# Patient Record
Sex: Male | Born: 1985 | Race: White | Hispanic: No | Marital: Married | Smoking: Former smoker
Health system: Southern US, Community
[De-identification: ages and names within clinical notes are randomized; demographics above are authoritative.]

## PROBLEM LIST (undated history)

## (undated) DIAGNOSIS — Z524 Kidney donor: Secondary | ICD-10-CM

## (undated) DIAGNOSIS — J45909 Unspecified asthma, uncomplicated: Secondary | ICD-10-CM

## (undated) HISTORY — PX: NEPHRECTOMY: SHX65

## (undated) HISTORY — PX: KIDNEY DONATION: SHX685

## (undated) HISTORY — DX: Unspecified asthma, uncomplicated: J45.909

---

## 2003-09-20 ENCOUNTER — Ambulatory Visit (HOSPITAL_BASED_OUTPATIENT_CLINIC_OR_DEPARTMENT_OTHER): Admission: RE | Admit: 2003-09-20 | Discharge: 2003-09-20 | Payer: Self-pay | Admitting: Orthopedic Surgery

## 2004-07-16 HISTORY — PX: SHOULDER SURGERY: SHX246

## 2008-11-25 ENCOUNTER — Emergency Department (HOSPITAL_COMMUNITY): Admission: EM | Admit: 2008-11-25 | Discharge: 2008-11-25 | Payer: Self-pay | Admitting: Family Medicine

## 2009-03-04 ENCOUNTER — Emergency Department (HOSPITAL_COMMUNITY): Admission: EM | Admit: 2009-03-04 | Discharge: 2009-03-04 | Payer: Self-pay | Admitting: Family Medicine

## 2009-03-18 ENCOUNTER — Encounter: Admission: RE | Admit: 2009-03-18 | Discharge: 2009-03-18 | Payer: Self-pay | Admitting: Family Medicine

## 2009-05-16 ENCOUNTER — Emergency Department (HOSPITAL_COMMUNITY): Admission: EM | Admit: 2009-05-16 | Discharge: 2009-05-16 | Payer: Self-pay | Admitting: Emergency Medicine

## 2010-01-20 ENCOUNTER — Encounter: Admission: RE | Admit: 2010-01-20 | Discharge: 2010-01-20 | Payer: Self-pay | Admitting: Orthopedic Surgery

## 2010-08-05 ENCOUNTER — Encounter: Payer: Self-pay | Admitting: Orthopedic Surgery

## 2010-10-05 ENCOUNTER — Inpatient Hospital Stay (INDEPENDENT_AMBULATORY_CARE_PROVIDER_SITE_OTHER)
Admission: RE | Admit: 2010-10-05 | Discharge: 2010-10-05 | Disposition: A | Discharge status: Home / Self Care | Payer: PRIVATE HEALTH INSURANCE | Source: Ambulatory Visit | Attending: Family Medicine | Admitting: Family Medicine

## 2010-10-05 DIAGNOSIS — J40 Bronchitis, not specified as acute or chronic: Secondary | ICD-10-CM

## 2010-10-05 DIAGNOSIS — J069 Acute upper respiratory infection, unspecified: Secondary | ICD-10-CM

## 2010-10-18 LAB — CBC
HCT: 46.9 % (ref 39.0–52.0)
Hemoglobin: 16.2 g/dL (ref 13.0–17.0)
MCV: 91.6 fL (ref 78.0–100.0)
RDW: 13.6 % (ref 11.5–15.5)

## 2010-10-18 LAB — POCT I-STAT, CHEM 8
BUN: 12 mg/dL (ref 6–23)
Creatinine, Ser: 0.9 mg/dL (ref 0.4–1.5)
Hemoglobin: 17 g/dL (ref 13.0–17.0)
Potassium: 4.1 mEq/L (ref 3.5–5.1)
Sodium: 140 mEq/L (ref 135–145)

## 2010-10-18 LAB — DIFFERENTIAL
Basophils Absolute: 0 10*3/uL (ref 0.0–0.1)
Eosinophils Relative: 6 % — ABNORMAL HIGH (ref 0–5)
Lymphocytes Relative: 26 % (ref 12–46)
Monocytes Absolute: 0.7 10*3/uL (ref 0.1–1.0)

## 2010-12-01 NOTE — Op Note (Signed)
NAME:  Costa, Jorge                            ACCOUNT NO.:  [id number]   MEDICAL RECORD NO.:  [id number]                   PATIENT TYPE:  AMB   LOCATION:  DSC                                  FACILITY:  [facility]   PHYSICIAN:  Robert A. Thurston Hole, M.D.              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   OPERATION PERFORMED:  1. Right shoulder examination under anesthesia followed by arthroscopically     assisted Bankart repair using Arthrex suture anchors times three     anteriorly and one posteriorly.  2. Right shoulder rotator interval closure.  3. Right shoulder arthroscopic partial labrum tear debridement.   SURGEON:  Elana Alm. Thurston Hole, M.D.   ASSISTANT:  Julien Girt, P.A.   ANESTHESIA:  General.   COMPLICATIONS:  None.   OPERATIVE TIME:  1-1/2 hours.   INDICATIONS FOR PROCEDURE:  Mr. 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  Exam and MRI has revealed a Bankart lesion and  labrum tear and he is now to undergo arthroscopy and repair.   DESCRIPTION OF PROCEDURE:  Mr. Rodier was brought to the operating room on  September 20, 2003 after an interscalene block had been placed in the holding  room by anesthesia for postoperative pain control.  He was placed on the  operating room in supine position.  After being placed under general  anesthesia, his right shoulder was examination under anesthesia.  Range of  motion revealed that he had full range of motion with anterior inferior  laxity on the right and subluxability which he had not had on the left.  Both shoulders had similar mild posterior laxity equal  in nature.  He was  then placed in beach chair position and his shoulder and arm was prepped  using sterile DuraPrep and draped using sterile technique.  He received  Ancef 1 g IV preoperatively for prophylaxis.  Initially, arthroscopy was  performed through a posterior arthroscopic portal.  The arthroscope with a  pump attached was placed and through an anterior portal an arthroscopic  probe was placed.  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  Articular surface  on the humeral head was intact.  He had tearing of the anterior inferior  glenohumeral ligament complex and anterior inferior labrum as well as the  middle glenohumeral ligament and this was felt to be repairable  arthroscopically.  The posterior labrum showed mild fraying.  This was  debrided but was not detached.  Biceps tendon anchor and biceps tendon was  intact and the rotator cuff was thoroughly inspected and it was found to be  intact.  Partial tearing of the anterior labrum was debrided.  After this  was done, then an accessory anterior portal was made and then three separate  Arthrex suture anchors were placed, one in the 5 o'clock position, one in  the 3 o'clock and one in the 2 o'clock position on the glenoid rim.  Each of  these anchors had the sutures that were through them arthroscopically passed  starting with the lowest anterior inferior glenohumeral ligament tissue and  then the middle inferior and middle superior glenohumeral ligament tissue.  After each of these sutures was passed, they were tied down arthroscopically  thus completely reconstructing the anterior inferior and middle glenohumeral  ligament complex and eliminating this instability.  After this was done,  then through a posterior portal, a posterior inferior Arthrex suture anchor  was placed in the 7  o'clock position and this suture was arthroscopically  passed through the posterior inferior glenohumeral ligament complex and tied  arthroscopically as well and this reduced any remaining posterior inferior  instability.  At this point the shoulder could not be subluxed and had  significant stability restored.  The rotator interval was then repaired  using a #1 Vicryl suture through the superior aspect of the rotator interval  and through 25% of the subscapularis tendon in the middle glenohumeral  ligament and then this suture was tied extra-articularly, thus reducing any  redundancy in the rotator interval and further tightening the shoulder with  #1 Vicryl suture.  After this was done, it was felt that all of the  pathology had been satisfactorily addressed.  The instruments and cannulas  were removed.  The portals were closed with 3-0 nylon suture.  Sterile  dressings and a shoulder immobilizer was applied, the patient awakened,  extubated and taken to the recovery room in stable condition.  Sponge and  needle counts were correct times two at the end of this case.   FOLLOW UP:  Mr. Furry will be followed as an outpatient on Percocet and  Naprosyn.  See him back in the office in a week for suture removal and  follow-up.                                               Robert A. Thurston Hole, M.D.    RAW/MEDQ  D:  09/20/2003  T:  09/21/2003  Job:  [id number]

## 2012-07-01 ENCOUNTER — Encounter (INDEPENDENT_AMBULATORY_CARE_PROVIDER_SITE_OTHER): Payer: Self-pay | Admitting: General Surgery

## 2012-07-03 ENCOUNTER — Ambulatory Visit (INDEPENDENT_AMBULATORY_CARE_PROVIDER_SITE_OTHER): Payer: PRIVATE HEALTH INSURANCE | Admitting: General Surgery

## 2012-07-03 ENCOUNTER — Encounter (INDEPENDENT_AMBULATORY_CARE_PROVIDER_SITE_OTHER): Payer: Self-pay | Admitting: General Surgery

## 2012-07-03 VITALS — BP 118/80 | HR 76 | Temp 97.6°F | Resp 12 | Ht 70.0 in | Wt 168.6 lb

## 2012-07-03 DIAGNOSIS — K648 Other hemorrhoids: Secondary | ICD-10-CM | POA: Insufficient documentation

## 2012-07-03 NOTE — Progress Notes (Signed)
Patient ID: Jorge Costa, male   DOB: 1986-01-07, 26 y.o.   MRN: [id number]  Chief Complaint  Patient presents with  . New Evaluation    eval hems    HPI Jorge Costa is a 26 y.o. male.   HPI 26 yo WM referred by Dr Clarene Duke for evaluation of hemorrhoids. The patient states he has had intermittent problems with hemorrhoids for several years but  the past year has been more problematic. He states that he has a bowel movement once a day. He states that he used to sit on the commode for prolonged period time but has recently learned that he should not sit on the commode for longer than 5-10 minutes at a time. He does strain occasionally. He drinks on average 5-7 glasses of water a day. He denies any incontinence. He does have pain sometimes when having a bowel movement. He states that his hemorrhoids will tend to flare up. He states that generally they flare up several times a month. When they do flare up none of the hemorrhoids pop out with having a bowel movement he just has a lot of burning and some itching. He has used preparation H ointment as well as Preparation H wipes. He has noticed some blood on the toilet tissue. He denies any family history of colon or rectal cancer. He denies any weight loss. He does use a washcloth and soap to clean his perianal area while in the shower. He does drink a fair amount of caffeine. He states that the itching is more internal. He also uses sitz baths when the hemorrhoids flare Past Medical History  Diagnosis Date  . Childhood asthma     Past Surgical History  Procedure Date  . Shoulder surgery 2006    right    Family History  Problem Relation Age of Onset  . Hypertension Father   . Kidney failure Father     Social History History  Substance Use Topics  . Smoking status: Current Every Day Smoker  . Smokeless tobacco: Never Used  . Alcohol Use: Yes     Comment: 1-2 month    No Known Allergies  Current Outpatient Prescriptions  Medication  Sig Dispense Refill  . hydrocortisone (ANUSOL-HC) 25 MG suppository Place 25 mg rectally 2 (two) times daily.        Review of Systems Review of Systems  Constitutional: Negative for fever, chills, appetite change and unexpected weight change.  HENT: Negative for congestion and trouble swallowing.   Eyes: Negative for visual disturbance.  Respiratory: Negative for chest tightness and shortness of breath.   Cardiovascular: Negative for chest pain and leg swelling.       No PND, no orthopnea, no DOE  Gastrointestinal:       See HPI  Genitourinary: Negative for dysuria and hematuria.  Musculoskeletal: Negative.   Skin: Negative for rash.  Neurological: Negative for seizures and speech difficulty.  Hematological: Does not bruise/bleed easily.  Psychiatric/Behavioral: Negative for behavioral problems and confusion.    Blood pressure 118/80, pulse 76, temperature 97.6 F (36.4 C), temperature source Temporal, resp. rate 12, height 5\' 10"  (1.778 m), weight 168 lb 9.6 oz (76.476 kg).  Physical Exam Physical Exam  Vitals reviewed. Constitutional: He is oriented to person, place, and time. He appears well-developed and well-nourished. No distress.  HENT:  Head: Normocephalic and atraumatic.  Right Ear: External ear normal.  Left Ear: External ear normal.  Eyes: Conjunctivae normal are normal. No scleral icterus.  Neck:  Normal range of motion. Neck supple. No tracheal deviation present. No thyromegaly present.  Cardiovascular: Normal rate, regular rhythm and normal heart sounds.   Pulmonary/Chest: Effort normal and breath sounds normal. No respiratory distress. He has no wheezes.  Abdominal: Soft. Bowel sounds are normal. He exhibits no distension. There is no tenderness.  Genitourinary: Rectal exam shows internal hemorrhoid. Rectal exam shows no external hemorrhoid, no fissure and anal tone normal.       Good tone. Anoscopy - +rt ant & post internal hemorrhoid, rt Ant > post. Small left  lateral internal hemorrhoid  Musculoskeletal: He exhibits no edema.  Neurological: He is alert and oriented to person, place, and time.  Skin: Skin is warm and dry. No rash noted. He is not diaphoretic. No erythema.  Psychiatric: He has a normal mood and affect. His behavior is normal. Judgment and thought content normal.    Data Reviewed Dr Fredirick Maudlin referring note  Assessment    2 column, possible 3 column internal hemorrhoids    Plan    We discussed the etiology of hemorrhoids. The patient was given educational material as well as diagrams. We discussed nonoperative and operative management of hemorrhoidal disease.  We discussed the importance of having a daily soft bowel movement and avoiding constipation. We also discussed good bowel habits such as not reading in the bathroom, not straining, and drinking 6-8 glasses of water per day. We also discussed the importance of a high fiber diet. We discussed foods that were high in fiber as well as fiber supplements. We discussed the importance of trying to get 25-30 g of fiber per day in their diet. We discussed the need to start with a low dose of fiber and then gradually increasing their daily fiber dose over several weeks in order to avoid bloating and cramping.  We then discussed different surgical techniques for hemorrhoids, specifically hemorrhoidal banding and excisional hemorrhoidectomy.  PLAN: The patient is interested in surgical intervention. I believe the majority of his internal hemorrhoids are amenable to hemorrhoidal banding. However the one on the right may require excisional hemorrhoidectomy. Nonetheless he is in agreeable to starting with nonoperative management for a few weeks followed by exam under anesthesia, hemorrhoidal banding and possible excisional hemorrhoidectomy. We discussed the importance of a high-fiber diet and drinking plenty of water regardless of whether or not surgery is performed. He is going to start  nonoperative management and see how he does. If he is still having problems he is going to schedule surgery.  I discussed the procedure in detail.  The patient was given Agricultural engineer.  We discussed the risks and benefits of surgery including, but not limited to bleeding, infection, blood clot formation, anesthesia risk, urinary retention, hemorrhoid recurrence, injury to the sphincters resulting in incontinence, and the rare possibility of anal canal narrowing. I explained that the likelihood of improvement of their symptoms is good  We discussed the typical postoperative course.  I stressed the importance of not becoming constipated after surgery.  The patient was encouraged to limit pain medication if possible as this increases the likelihood of becoming constipated. The patient was advised to take stool softners & drink 8-10 glasses of non-carbonated, non-alcoholic beverages per day and to eat a high fiber diet.  I also encouraged soaking in a water warm bath for 15 minutes at a time several times a day and after a bowel movement.  The patient was advised to take laxatives such as milk of magnesia or Miralax if no  bowel movement three days after surgery.  The patient was advised to expect some blood tinged drainage as well as some blood in their bowel movements.   Mary Sella. Andrey Campanile, MD, FACS General, Bariatric, & Minimally Invasive Surgery [facility], [place]        [facility] M 07/03/2012, 5:04 PM

## 2012-07-03 NOTE — Patient Instructions (Addendum)
Call our office [phone] if you decide you would like to proceed with surgery  GETTING TO GOOD BOWEL HEALTH. Irregular bowel habits such as constipation and diarrhea can lead to many problems over time.  Having one soft bowel movement a day is the most important way to prevent further problems.  The anorectal canal is designed to handle stretching and feces to safely manage our ability to get rid of solid waste (feces, poop, stool) out of our body.  BUT, hard constipated stools can act like ripping concrete bricks and diarrhea can be a burning fire to this very sensitive area of our body, causing inflamed hemorrhoids, anal fissures, increasing risk is perirectal abscesses, abdominal pain/bloating, an making irritable bowel worse.     The goal: ONE SOFT BOWEL MOVEMENT A DAY!  To have soft, regular bowel movements:    Drink at least 8 tall glasses of water a day.     Take plenty of fiber.  Fiber is the undigested part of plant food that passes into the colon, acting s "natures broom" to encourage bowel motility and movement.  Fiber can absorb and hold large amounts of water. This results in a larger, bulkier stool, which is soft and easier to pass. Work gradually over several weeks up to 6 servings a day of fiber (25g a day even more if needed) in the form of: o Vegetables -- Root (potatoes, carrots, turnips), leafy green (lettuce, salad greens, celery, spinach), or cooked high residue (cabbage, broccoli, etc) o Fruit -- Fresh (unpeeled skin & pulp), Dried (prunes, apricots, cherries, etc ),  or stewed ( applesauce)  o Whole grain breads, pasta, etc (whole wheat)  o Bran cereals    Bulking Agents -- This type of water-retaining fiber generally is easily obtained each day by one of the following:  o Psyllium bran -- The psyllium plant is remarkable because its ground seeds can retain so much water. This product is available as Metamucil, Konsyl, Effersyllium, Per Diem Fiber, or the less expensive generic  preparation in drug and health food stores. Although labeled a laxative, it really is not a laxative.  o Methylcellulose -- This is another fiber derived from wood which also retains water. It is available as Citrucel. o Polyethylene Glycol - and "artificial" fiber commonly called Miralax or Glycolax.  It is helpful for people with gassy or bloated feelings with regular fiber o Flax Seed - a less gassy fiber than psyllium   No reading or other relaxing activity while on the toilet. If bowel movements take longer than 5 minutes, you are too constipated   AVOID CONSTIPATION.  High fiber and water intake usually takes care of this.  Sometimes a laxative is needed to stimulate more frequent bowel movements, but    Laxatives are not a good long-term solution as it can wear the colon out. o Osmotics (Milk of Magnesia, Fleets phosphosoda, Magnesium citrate, MiraLax, GoLytely) are safer than  o Stimulants (Senokot, Castor Oil, Dulcolax, Ex Lax)    o Do not take laxatives for more than 7days in a row.    IF SEVERELY CONSTIPATED, try a Bowel Retraining Program: o Do not use laxatives.  o Eat a diet high in roughage, such as bran cereals and leafy vegetables.  o Drink six (6) ounces of prune or apricot juice each morning.  o Eat two (2) large servings of stewed fruit each day.  o Take one (1) heaping tablespoon of a psyllium-based bulking agent twice a day. Use sugar-free  sweetener when possible to avoid excessive calories.  o Eat a normal breakfast.  o Set aside 15 minutes after breakfast to sit on the toilet, but do not strain to have a bowel movement.  o If you do not have a bowel movement by the third day, use an enema and repeat the above steps.    Controlling diarrhea o Switch to liquids and simpler foods for a few days to avoid stressing your intestines further. o Avoid dairy products (especially milk & ice cream) for a short time.  The intestines often can lose the ability to digest lactose when  stressed. o Avoid foods that cause gassiness or bloating.  Typical foods include beans and other legumes, cabbage, broccoli, and dairy foods.  Every person has some sensitivity to other foods, so listen to our body and avoid those foods that trigger problems for you. o Adding fiber (Citrucel, Metamucil, psyllium, Miralax) gradually can help thicken stools by absorbing excess fluid and retrain the intestines to act more normally.  Slowly increase the dose over a few weeks.  Too much fiber too soon can backfire and cause cramping & bloating. o Probiotics (such as active yogurt, Align, etc) may help repopulate the intestines and colon with normal bacteria and calm down a sensitive digestive tract.  Most studies show it to be of mild help, though, and such products can be costly. o Medicines:   Bismuth subsalicylate (ex. Kayopectate, Pepto Bismol) every 30 minutes for up to 6 doses can help control diarrhea.  Avoid if pregnant.   Loperamide (Immodium) can slow down diarrhea.  Start with two tablets (4mg  total) first and then try one tablet every 6 hours.  Avoid if you are having fevers or severe pain.  If you are not better or start feeling worse, stop all medicines and call your doctor for advice o Call your doctor if you are getting worse or not better.  Sometimes further testing (cultures, endoscopy, X-ray studies, bloodwork, etc) may be needed to help diagnose and treat the cause of the diarrhea.  Fiber Content in Foods Drinking plenty of fluids and consuming foods high in fiber can help with constipation. See the list below for the fiber content of some common foods. Starches and Grains / Dietary Fiber (g)  Cheerios, 1 cup / 3 g  Kellogg's Corn Flakes, 1 cup / 0.7 g  Rice Krispies, 1  cup / 0.3 g  Quaker Oat Life Cereal,  cup / 2.1 g  Oatmeal, instant (cooked),  cup / 2 g  Kellogg's Frosted Mini Wheats, 1 cup / 5.1 g  Rice, brown, long-grain (cooked), 1 cup / 3.5 g  Rice, white,  long-grain (cooked), 1 cup / 0.6 g  Macaroni, cooked, enriched, 1 cup / 2.5 g Legumes / Dietary Fiber (g)  Beans, baked, canned, plain or vegetarian,  cup / 5.2 g  Beans, kidney, canned,  cup / 6.8 g  Beans, pinto, dried (cooked),  cup / 7.7 g  Beans, pinto, canned,  cup / 5.5 g Breads and Crackers / Dietary Fiber (g)  Graham crackers, plain or honey, 2 squares / 0.7 g  Saltine crackers, 3 squares / 0.3 g  Pretzels, plain, salted, 10 pieces / 1.8 g  Bread, whole-wheat, 1 slice / 1.9 g  Bread, white, 1 slice / 0.7 g  Bread, raisin, 1 slice / 1.2 g  Bagel, plain, 3 oz / 2 g  Tortilla, flour, 1 oz / 0.9 g  Tortilla, corn, 1 small /  1.5 g  Bun, hamburger or hotdog, 1 small / 0.9 g Fruits / Dietary Fiber (g)  Apple, raw with skin, 1 medium / 4.4 g  Applesauce, sweetened,  cup / 1.5 g  Banana,  medium / 1.5 g  Grapes, 10 grapes / 0.4 g  Orange, 1 small / 2.3 g  Raisin, 1.5 oz / 1.6 g  Melon, 1 cup / 1.4 g Vegetables / Dietary Fiber (g)  Green beans, canned,  cup / 1.3 g  Carrots (cooked),  cup / 2.3 g  Broccoli (cooked),  cup / 2.8 g  Peas, frozen (cooked),  cup / 4.4 g  Potatoes, mashed,  cup / 1.6 g  Lettuce, 1 cup / 0.5 g  Corn, canned,  cup / 1.6 g  Tomato,  cup / 1.1 g Document Released: 11/18/2006 Document Revised: 09/24/2011 Document Reviewed: 01/13/2007 [facility] Patient Information 2013 [place], [place].

## 2012-11-03 DIAGNOSIS — Z524 Kidney donor: Secondary | ICD-10-CM | POA: Insufficient documentation

## 2012-11-21 DIAGNOSIS — S301XXA Contusion of abdominal wall, initial encounter: Secondary | ICD-10-CM | POA: Insufficient documentation

## 2013-04-06 ENCOUNTER — Emergency Department (HOSPITAL_COMMUNITY)
Admission: EM | Admit: 2013-04-06 | Discharge: 2013-04-06 | Disposition: A | Discharge status: Home / Self Care | Payer: PRIVATE HEALTH INSURANCE | Attending: Emergency Medicine | Admitting: Emergency Medicine

## 2013-04-06 ENCOUNTER — Encounter (HOSPITAL_COMMUNITY): Payer: Self-pay | Admitting: Emergency Medicine

## 2013-04-06 ENCOUNTER — Emergency Department (HOSPITAL_COMMUNITY): Payer: PRIVATE HEALTH INSURANCE

## 2013-04-06 DIAGNOSIS — J45909 Unspecified asthma, uncomplicated: Secondary | ICD-10-CM | POA: Insufficient documentation

## 2013-04-06 DIAGNOSIS — Z87891 Personal history of nicotine dependence: Secondary | ICD-10-CM | POA: Insufficient documentation

## 2013-04-06 DIAGNOSIS — I861 Scrotal varices: Secondary | ICD-10-CM

## 2013-04-06 LAB — URINALYSIS, ROUTINE W REFLEX MICROSCOPIC
Glucose, UA: NEGATIVE mg/dL
Hgb urine dipstick: NEGATIVE
Specific Gravity, Urine: 1.017 (ref 1.005–1.030)
Urobilinogen, UA: 0.2 mg/dL (ref 0.0–1.0)

## 2013-04-06 NOTE — ED Provider Notes (Signed)
CSN: [id number]     Arrival date & time 04/06/13  0804 History   First MD Initiated Contact with Patient 04/06/13 [phone]     Chief Complaint  Patient presents with  . Groin Swelling   (Consider location/radiation/quality/duration/timing/severity/associated sxs/prior Treatment) Patient is a 27 y.o. male presenting with male genitourinary complaint.  Male GU Problem Presenting symptoms comment:  Moderate sized left sided scrotal swelling Context comment:  Heavy lifting 2 days ago during white water rafting Relieved by:  Nothing Worsened by:  Nothing tried Associated symptoms: scrotal swelling   Associated symptoms: no abdominal pain, no diarrhea, no fever, no groin pain, no hematuria, no nausea, no penile redness, no penile swelling, no urinary frequency and no vomiting     Past Medical History  Diagnosis Date  . Childhood asthma    Past Surgical History  Procedure Laterality Date  . Shoulder surgery  2006    right  . Kidney donation    . Nephrectomy     Family History  Problem Relation Age of Onset  . Hypertension Father   . Kidney failure Father    History  Substance Use Topics  . Smoking status: Former Games developer  . Smokeless tobacco: Never Used  . Alcohol Use: Yes     Comment: 1-2 month    Review of Systems  Constitutional: Negative for fever.  HENT: Negative for congestion.   Respiratory: Negative for cough and shortness of breath.   Cardiovascular: Negative for chest pain.  Gastrointestinal: Negative for nausea, vomiting, abdominal pain and diarrhea.  Genitourinary: Positive for scrotal swelling. Negative for frequency, hematuria and penile swelling.  All other systems reviewed and are negative.    Allergies  Nsaids  Home Medications   Current Outpatient Rx  Name  Route  Sig  Dispense  Refill  . acetaminophen (TYLENOL) 500 MG tablet   Oral   Take 1,000 mg by mouth every 6 (six) hours as needed for pain.          BP 121/76  Pulse 70  Temp(Src) 98 F  (36.7 C) (Oral)  Resp 18  SpO2 97% Physical Exam  Nursing note and vitals reviewed. Constitutional: He is oriented to person, place, and time. He appears well-developed and well-nourished. No distress.  HENT:  Head: Normocephalic and atraumatic.  Mouth/Throat: Oropharynx is clear and moist.  Eyes: Conjunctivae are normal. Pupils are equal, round, and reactive to light. No scleral icterus.  Neck: Neck supple.  Cardiovascular: Normal rate, regular rhythm, normal heart sounds and intact distal pulses.   No murmur heard. Pulmonary/Chest: Effort normal and breath sounds normal. No stridor. No respiratory distress. He has no wheezes. He has no rales.  Abdominal: Soft. He exhibits no distension. There is no tenderness. Hernia confirmed negative in the right inguinal area and confirmed negative in the left inguinal area.  Genitourinary: Penis normal. Right testis shows no swelling and no tenderness. Left testis shows swelling and tenderness (minimal).  Musculoskeletal: Normal range of motion. He exhibits no edema.  Lymphadenopathy:       Left: Inguinal adenopathy:  unable to palpate a defect.  Neurological: He is alert and oriented to person, place, and time.  Skin: Skin is warm and dry. No rash noted.  Psychiatric: He has a normal mood and affect. His behavior is normal.    ED Course  Procedures (including critical care time) Labs Review Labs Reviewed  URINALYSIS, ROUTINE W REFLEX MICROSCOPIC   Imaging Review US Scrotum  04/06/2013   CLINICAL DATA:  27 year old male with pain in groin swelling. Recent heavy lifting.  EXAM: SCROTAL ULTRASOUND  DOPPLER ULTRASOUND OF THE TESTICLES  TECHNIQUE: Complete ultrasound examination of the testicles, epididymis, and other scrotal structures was performed. Color and spectral Doppler ultrasound were also utilized to evaluate blood flow to the testicles.  COMPARISON:  None.  FINDINGS: Right testis:  4.9 x 2.5 x 2.7 cm. Normal echotexture.  Left testis:   4.5 x 2.9 x 3.0 cm. Normal echotexture.  Right epididymis:  Normal in size and appearance.  Left epididymis:  Normal in size and appearance.  Hydrocele:  Small simple appearing hydrocele on the left.  Varicocele: Positive varicocele on the left (images 54 and 55). The left inguinal canal is mildly heterogeneous. No discrete bowel loop is identified.  Pulsed Doppler interrogation of both testes demonstrates low resistance arterial and venous waveforms bilaterally.  IMPRESSION: 1. No evidence of testicular torsion.  2. Small left hydrocele. Left varicocele. The possibility of a small left inguinal hernia cannot be excluded.   Electronically Signed   By: Augusto Gamble M.D.   On: 04/06/2013 09:52   [place] Art/ven Flow Abd Pelv Doppler  04/06/2013   CLINICAL DATA:  27 year old male with pain in groin swelling. Recent heavy lifting.  EXAM: SCROTAL ULTRASOUND  DOPPLER ULTRASOUND OF THE TESTICLES  TECHNIQUE: Complete ultrasound examination of the testicles, epididymis, and other scrotal structures was performed. Color and spectral Doppler ultrasound were also utilized to evaluate blood flow to the testicles.  COMPARISON:  None.  FINDINGS: Right testis:  4.9 x 2.5 x 2.7 cm. Normal echotexture.  Left testis:  4.5 x 2.9 x 3.0 cm. Normal echotexture.  Right epididymis:  Normal in size and appearance.  Left epididymis:  Normal in size and appearance.  Hydrocele:  Small simple appearing hydrocele on the left.  Varicocele: Positive varicocele on the left (images 54 and 55). The left inguinal canal is mildly heterogeneous. No discrete bowel loop is identified.  Pulsed Doppler interrogation of both testes demonstrates low resistance arterial and venous waveforms bilaterally.  IMPRESSION: 1. No evidence of testicular torsion.  2. Small left hydrocele. Left varicocele. The possibility of a small left inguinal hernia cannot be excluded.   Electronically Signed   By: Augusto Gamble M.D.   On: 04/06/2013 09:52    MDM   1. Varicocele    Left  testicular swelling. Minimal tenderness. Unable to palpate a defect.  Ultrasound consistent with varicocele. I don't think he has a hernia.  He does not have a palpable defect or significant tenderness or pain.  Will refer to urology.  Return precautions given.     Candyce Churn, MD 04/06/13 [phone]

## 2013-04-06 NOTE — ED Notes (Signed)
Pt returned from US

## 2013-04-06 NOTE — ED Notes (Signed)
Pt reports swollen painful area to left testicle which he noticed yesterday. Denies area has redness, warmth or discoloration. Pt alert, oriented x4, NAD.

## 2013-07-02 DIAGNOSIS — I861 Scrotal varices: Secondary | ICD-10-CM | POA: Insufficient documentation

## 2013-07-02 DIAGNOSIS — IMO0002 Reserved for concepts with insufficient information to code with codable children: Secondary | ICD-10-CM | POA: Insufficient documentation

## 2014-08-21 ENCOUNTER — Emergency Department (INDEPENDENT_AMBULATORY_CARE_PROVIDER_SITE_OTHER)
Admission: EM | Admit: 2014-08-21 | Discharge: 2014-08-21 | Disposition: A | Discharge status: Home / Self Care | Payer: PRIVATE HEALTH INSURANCE | Source: Home / Self Care | Attending: Family Medicine | Admitting: Family Medicine

## 2014-08-21 DIAGNOSIS — H6591 Unspecified nonsuppurative otitis media, right ear: Secondary | ICD-10-CM

## 2014-08-21 DIAGNOSIS — J039 Acute tonsillitis, unspecified: Secondary | ICD-10-CM

## 2014-08-21 MED ORDER — AMOXICILLIN-POT CLAVULANATE 875-125 MG PO TABS: 1.0000 | ORAL_TABLET | Freq: Two times a day (BID) | ORAL | Status: DC

## 2014-08-21 MED ORDER — PREDNISONE 10 MG PO TABS: 30.0000 mg | ORAL_TABLET | Freq: Every day | ORAL | Status: DC

## 2014-08-21 NOTE — Discharge Instructions (Signed)
Thank you for coming in today. °Call or go to the emergency room if you get worse, have trouble breathing, have chest pains, or palpitations.  ° ° °Tonsillitis °Tonsillitis is an infection of the throat that causes the tonsils to become red, tender, and swollen. Tonsils are collections of lymphoid tissue at the back of the throat. Each tonsil has crevices (crypts). Tonsils help fight nose and throat infections and keep infection from spreading to other parts of the body for the first 18 months of life.  °CAUSES °Sudden (acute) tonsillitis is usually caused by infection with streptococcal bacteria. Long-lasting (chronic) tonsillitis occurs when the crypts of the tonsils become filled with pieces of food and bacteria, which makes it easy for the tonsils to become repeatedly infected. °SYMPTOMS  °Symptoms of tonsillitis include: °· A sore throat, with possible difficulty swallowing. °· White patches on the tonsils. °· Fever. °· Tiredness. °· New episodes of snoring during sleep, when you did not snore before. °· Small, foul-smelling, yellowish-white pieces of material (tonsilloliths) that you occasionally cough up or spit out. The tonsilloliths can also cause you to have bad breath. °DIAGNOSIS °Tonsillitis can be diagnosed through a physical exam. Diagnosis can be confirmed with the results of lab tests, including a throat culture. °TREATMENT  °The goals of tonsillitis treatment include the reduction of the severity and duration of symptoms and prevention of associated conditions. Symptoms of tonsillitis can be improved with the use of steroids to reduce the swelling. Tonsillitis caused by bacteria can be treated with antibiotic medicines. Usually, treatment with antibiotic medicines is started before the cause of the tonsillitis is known. However, if it is determined that the cause is not bacterial, antibiotic medicines will not treat the tonsillitis. If attacks of tonsillitis are severe and frequent, your health care  provider may recommend surgery to remove the tonsils (tonsillectomy). °HOME CARE INSTRUCTIONS  °· Rest as much as possible and get plenty of sleep. °· Drink plenty of fluids. While the throat is very sore, eat soft foods or liquids, such as sherbet, soups, or instant breakfast drinks. °· Eat frozen ice pops. °· Gargle with a warm or cold liquid to help soothe the throat. Mix 1/4 teaspoon of salt and 1/4 teaspoon of baking soda in 8 oz of water. °SEEK MEDICAL CARE IF:  °· Large, tender lumps develop in your neck. °· A rash develops. °· A green, yellow-brown, or bloody substance is coughed up. °· You are unable to swallow liquids or food for 24 hours. °· You notice that only one of the tonsils is swollen. °SEEK IMMEDIATE MEDICAL CARE IF:  °· You develop any new symptoms such as vomiting, severe headache, stiff neck, chest pain, or trouble breathing or swallowing. °· You have severe throat pain along with drooling or voice changes. °· You have severe pain, unrelieved with recommended medications. °· You are unable to fully open the mouth. °· You develop redness, swelling, or severe pain anywhere in the neck. °· You have a fever. °MAKE SURE YOU:  °· Understand these instructions. °· Will watch your condition. °· Will get help right away if you are not doing well or get worse. °Document Released: 04/11/2005 Document Revised: 11/16/2013 Document Reviewed: 12/19/2012 °Exit[employer]® Patient Information ©2015 [employer]. This information is not intended to replace advice given to you by your health care provider. Make sure you discuss any questions you have with your health care provider. ° °

## 2014-08-21 NOTE — ED Notes (Signed)
Patient c/o sore throat and sinus issues x 1 week

## 2014-08-21 NOTE — ED Provider Notes (Signed)
Jorge Costa is a 29 y.o. male who presents to Urgent Care today for sore throat postnasal drip cough and right-sided ear pressure present for 5-6 days. No vomiting or diarrhea. No trouble breathing. Patient has tried over-the-counter medications which have helped a bit.   Past Medical History  Diagnosis Date  . Childhood asthma    Past Surgical History  Procedure Laterality Date  . Shoulder surgery  2006    right  . Kidney donation    . Nephrectomy     History  Substance Use Topics  . Smoking status: Former Games developermoker  . Smokeless tobacco: Never Used  . Alcohol Use: Yes     Comment: 1-2 month   ROS as above Medications: No current facility-administered medications for this encounter.   Current Outpatient Prescriptions  Medication Sig Dispense Refill  . amoxicillin-clavulanate (AUGMENTIN) 875-125 MG per tablet Take 1 tablet by mouth every 12 (twelve) hours. 14 tablet 0  . predniSONE (DELTASONE) 10 MG tablet Take 3 tablets (30 mg total) by mouth daily. 15 tablet 0   Allergies  Allergen Reactions  . Nsaids Other (See Comments)    Patient has 1 kidney     Exam:  BP 132/78 mmHg  Pulse 94  Temp(Src) 98.1 F (36.7 C) (Oral)  Ht 5\' 10"  (1.778 m)  Wt 188 lb (85.276 kg)  BMI 26.98 kg/m2  SpO2 96% Gen: Well NAD HEENT: EOMI,  MMM tonsils are erythematous and swollen bilaterally with exudate. Right tympanic membrane with effusion without erythema. Left is normal-appearing. Nontender mastoids bilaterally. Lungs: Normal work of breathing. CTABL Heart: RRR no MRG Abd: NABS, Soft. Nondistended, Nontender Exts: Brisk capillary refill, warm and well perfused.   No results found for this or any previous visit (from the past 24 hour(s)). No results found.  Assessment and Plan: 29 y.o. male with tonsillitis. Treat with Augmentin and prednisone. Return as needed.  Discussed warning signs or symptoms. Please see discharge instructions. Patient expresses understanding.     Rodolph BongEvan  S Mandie Crabbe, MD 08/21/14 [phone]

## 2015-03-14 IMAGING — US US SCROTUM
1 series · 14 of 25 positions shown · non-contrast
Comparison: None.

CLINICAL DATA: 27-year-old male with pain in groin swelling. Recent
heavy lifting.

EXAM:
SCROTAL ULTRASOUND
DOPPLER ULTRASOUND OF THE TESTICLES
TECHNIQUE: Complete ultrasound examination of the testicles, epididymis, and
other scrotal structures was performed. Color and spectral Doppler
ultrasound were also utilized to evaluate blood flow to the
testicles.

[Series 1: us scrotum · 0.06mm/px · 14 of 73 slices shown]
[im 1/73]
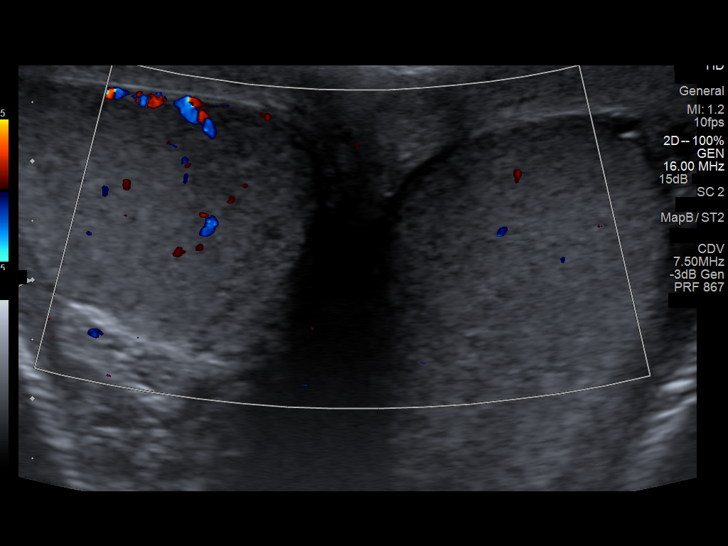
[im 7/73]
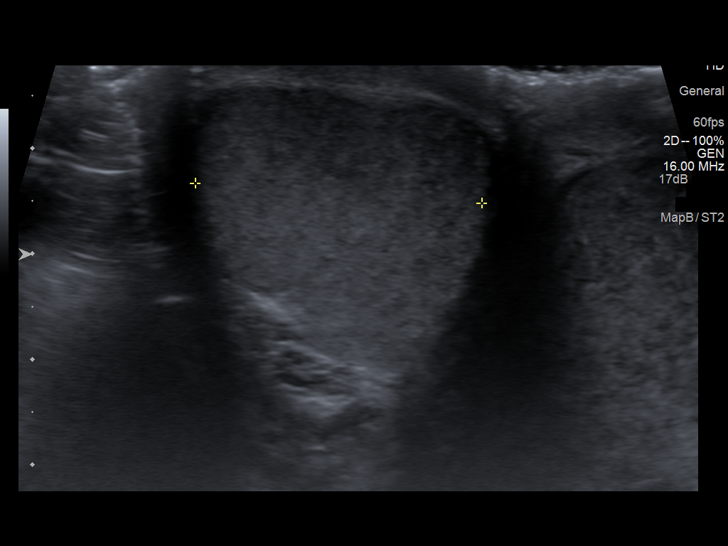
[im 13/73]
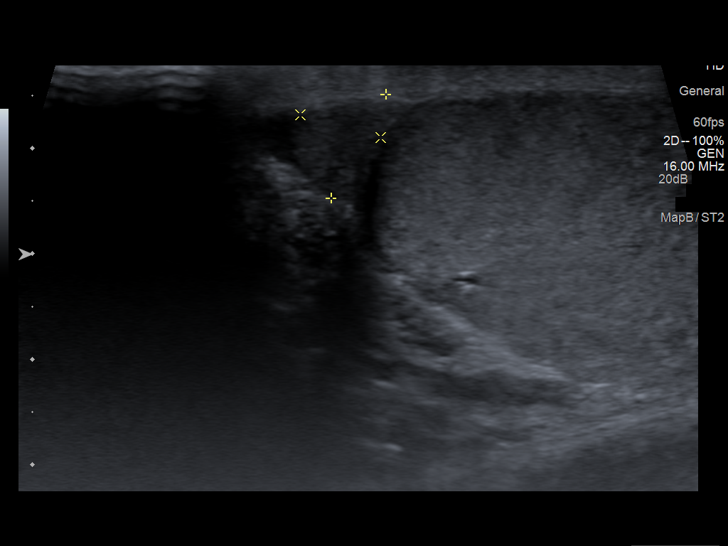
[im 19/73]
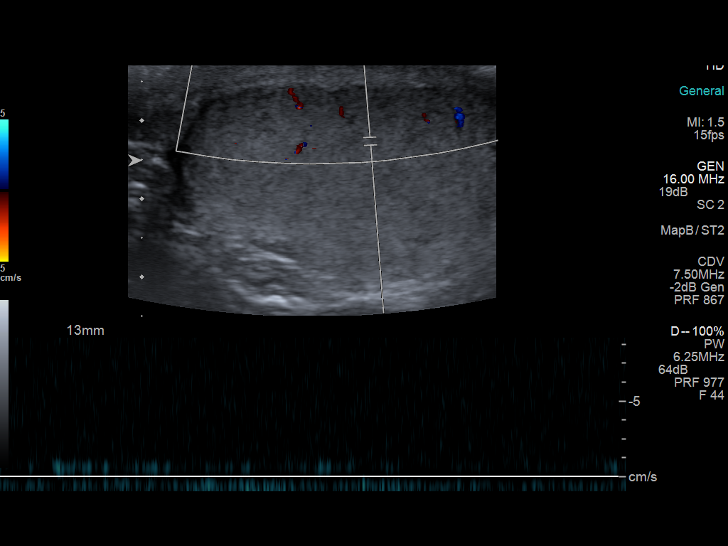
[im 25/73]
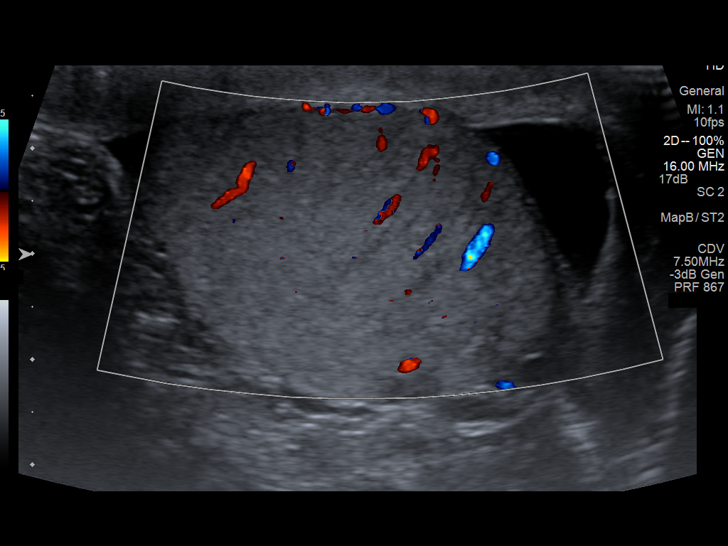
[im 28/73]
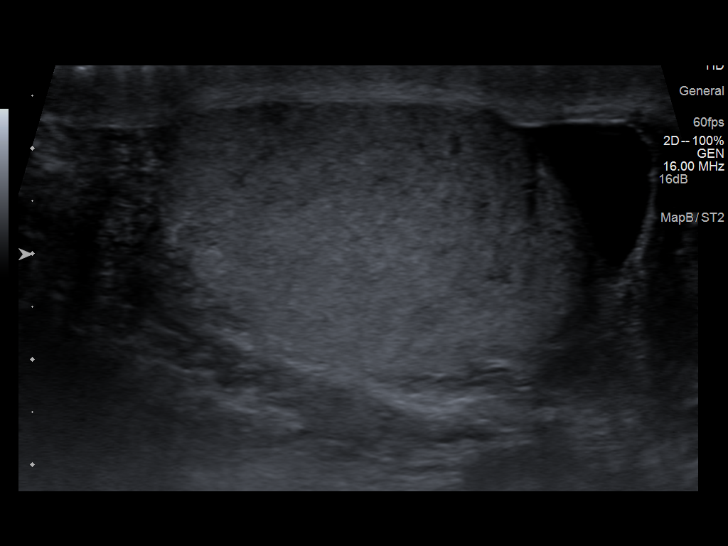
[im 34/73]
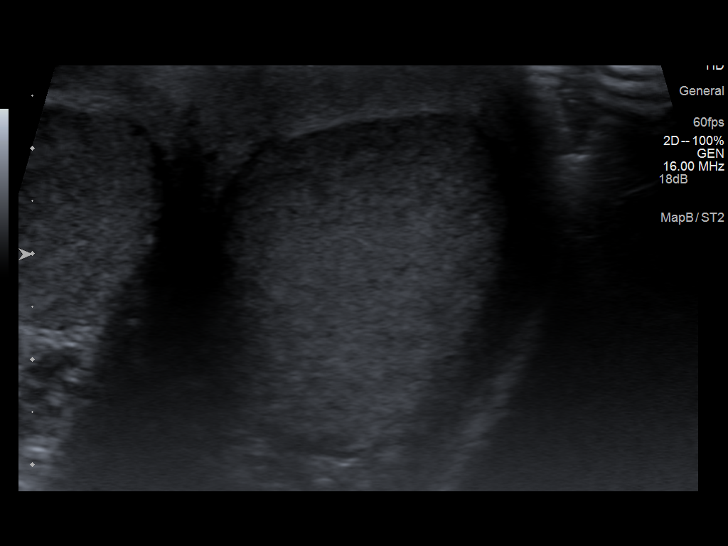
[im 40/73]
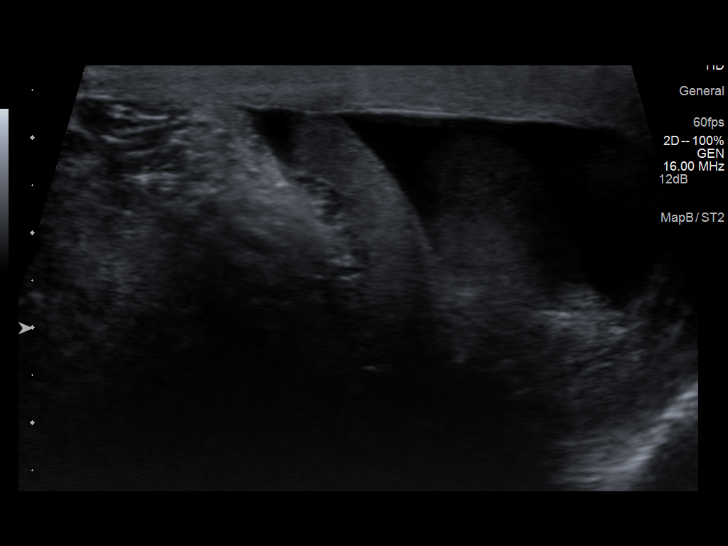
[im 46/73]
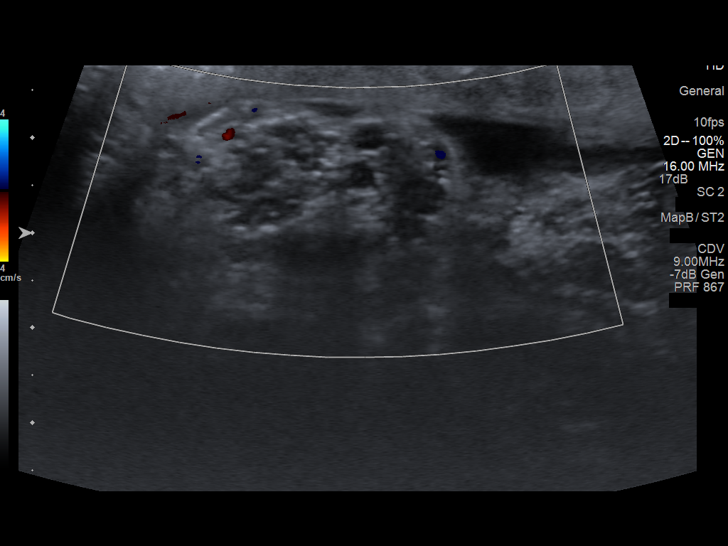
[im 49/73]
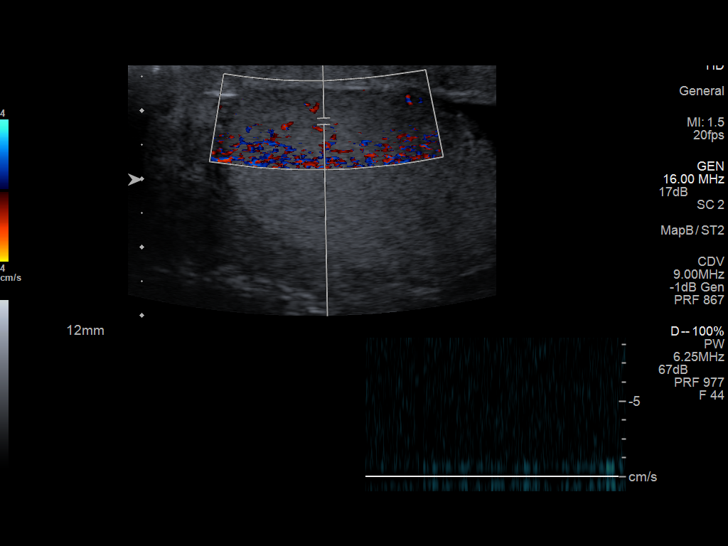
[im 55/73]
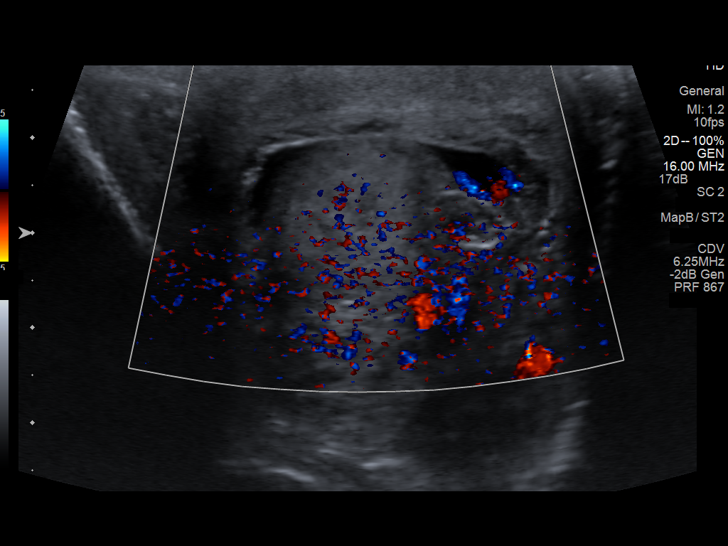
[im 61/73]
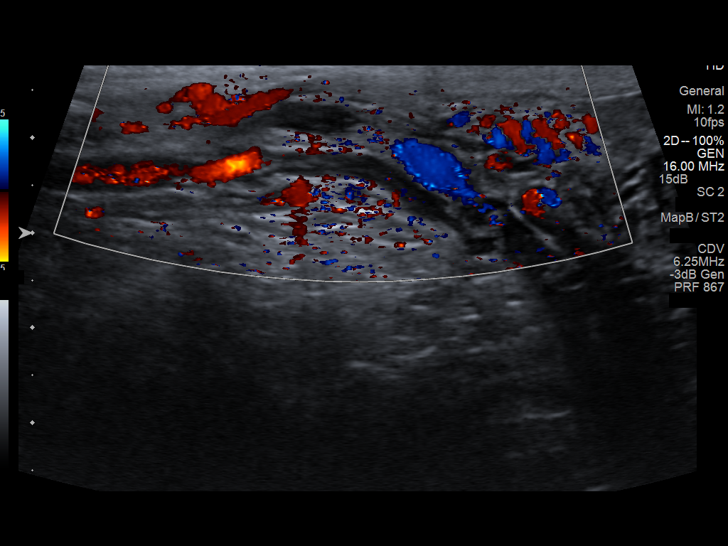
[im 67/73]
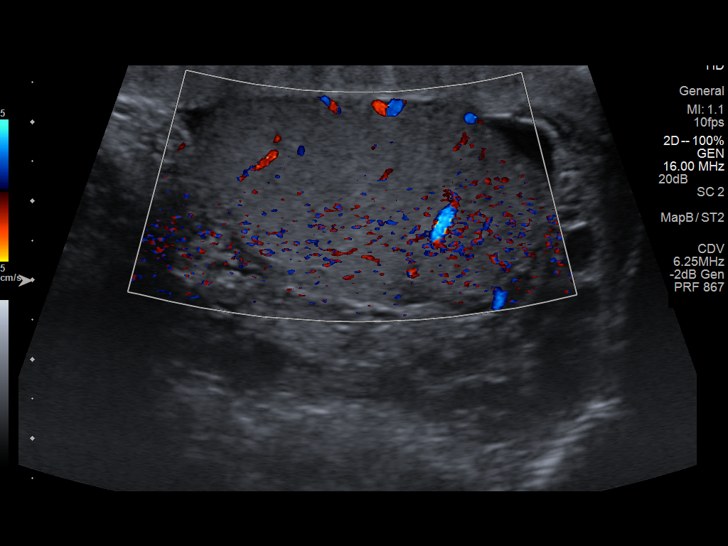
[im 73/73]
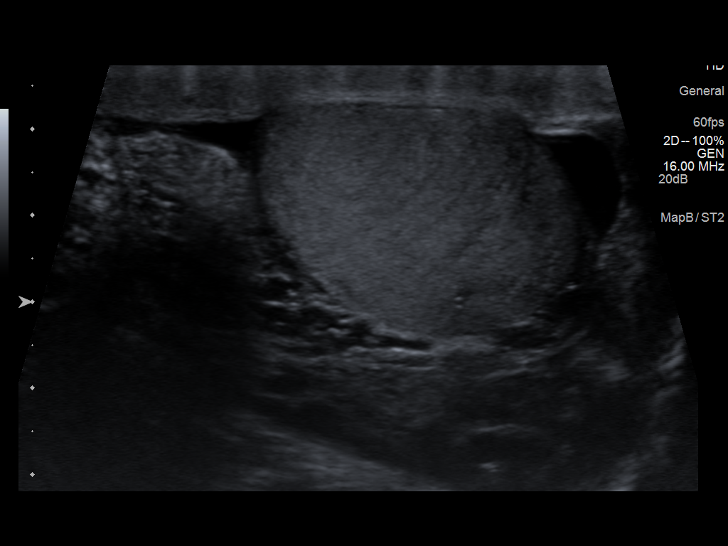

[14 of 25 positions shown; findings below may reference images not displayed]

FINDINGS: Right testis:  4.9 x 2.5 x 2.7 cm. Normal echotexture.

Left testis:  4.5 x 2.9 x 3.0 cm. Normal echotexture.

Right epididymis:  Normal in size and appearance.

Left epididymis:  Normal in size and appearance.

Hydrocele:  Small simple appearing hydrocele on the left.

Varicocele: Positive varicocele on the left (images 54 and 55). The
left inguinal canal is mildly heterogeneous. No discrete bowel loop
is identified.

Pulsed Doppler interrogation of both testes demonstrates low
resistance arterial and venous waveforms bilaterally.
IMPRESSION: 1. No evidence of testicular torsion.

2. Small left hydrocele. Left varicocele. The possibility of a small
left inguinal hernia cannot be excluded.

## 2017-06-15 ENCOUNTER — Emergency Department
Admission: EM | Admit: 2017-06-15 | Discharge: 2017-06-15 | Disposition: A | Discharge status: Home / Self Care | Payer: Self-pay | Source: Home / Self Care | Attending: Family Medicine | Admitting: Family Medicine

## 2017-06-15 ENCOUNTER — Other Ambulatory Visit: Payer: Self-pay

## 2017-06-15 ENCOUNTER — Encounter: Payer: Self-pay | Admitting: *Deleted

## 2017-06-15 DIAGNOSIS — R062 Wheezing: Secondary | ICD-10-CM

## 2017-06-15 DIAGNOSIS — J209 Acute bronchitis, unspecified: Secondary | ICD-10-CM

## 2017-06-15 HISTORY — DX: Kidney donor: Z52.4

## 2017-06-15 MED ORDER — PREDNISONE 20 MG PO TABS: ORAL_TABLET | ORAL | 0 refills | Status: DC

## 2017-06-15 MED ORDER — AZITHROMYCIN 250 MG PO TABS: 250.0000 mg | ORAL_TABLET | Freq: Every day | ORAL | 0 refills | Status: DC

## 2017-06-15 MED ORDER — ALBUTEROL SULFATE HFA 108 (90 BASE) MCG/ACT IN AERS
1.0000 | INHALATION_SPRAY | Freq: Four times a day (QID) | RESPIRATORY_TRACT | 0 refills | Status: DC | PRN
Start: 2017-06-15 — End: 2021-03-07

## 2017-06-15 NOTE — Discharge Instructions (Signed)
°  You may take 500mg acetaminophen every 4-6 hours or in combination with ibuprofen 400-600mg every 6-8 hours as needed for pain, inflammation, and fever. ° °Be sure to drink at least eight 8oz glasses of water to stay well hydrated and get at least 8 hours of sleep at night, preferably more while sick.  ° °Please take antibiotics as prescribed and be sure to complete entire course even if you start to feel better to ensure infection does not come back. ° °

## 2017-06-15 NOTE — ED Provider Notes (Signed)
[facility]    CSN: [id number] Arrival date & time: 06/15/17  1133     History   Chief Complaint Chief Complaint  Patient presents with  . Cough    HPI Jorge Costa is a 31 y.o. male.   HPI  Jorge Costa is a 31 y.o. male presenting to UC with c/o 1 week of gradually worsening productive cough with yellow sputum, chest tightness and SOB with minimal exertion.  He has used his son's nebulizer machine with moderate relief. He has needed inhalers in the past when he gets sick but does not have one at home currently. Denies fever, chills, n/v/d.    Past Medical History:  Diagnosis Date  . Childhood asthma   . Kidney donor     Patient Active Problem List   Diagnosis Date Noted  . Internal hemorrhoids 07/03/2012    Past Surgical History:  Procedure Laterality Date  . KIDNEY DONATION    . NEPHRECTOMY    . SHOULDER SURGERY  2006   right       Home Medications    Prior to Admission medications   Medication Sig Start Date End Date Taking? Authorizing Provider  albuterol (PROVENTIL HFA;VENTOLIN HFA) 108 (90 Base) MCG/ACT inhaler Inhale 1-2 puffs into the lungs every 6 (six) hours as needed for wheezing or shortness of breath. 06/15/17   Lurene ShadowPhelps, Breiona Couvillon O, PA-C  azithromycin (ZITHROMAX) 250 MG tablet Take 1 tablet (250 mg total) by mouth daily. Take first 2 tablets together, then 1 every day until finished. 06/15/17   Lurene ShadowPhelps, Massiah Minjares O, PA-C  predniSONE (DELTASONE) 20 MG tablet 3 tabs po day one, then 2 po daily x 4 days 06/15/17   Lurene ShadowPhelps, Naasir Carreira O, PA-C    Family History Family History  Problem Relation Age of Onset  . Hypertension Father   . Kidney failure Father     Social History Social History   Tobacco Use  . Smoking status: Former Games developermoker  . Smokeless tobacco: Never Used  Substance Use Topics  . Alcohol use: Yes    Comment: 1-2 month  . Drug use: No     Allergies   Nsaids   Review of Systems Review of Systems  Constitutional: Negative for  chills and fever.  HENT: Positive for congestion and postnasal drip. Negative for ear pain, rhinorrhea, sore throat, trouble swallowing and voice change.   Respiratory: Positive for cough, chest tightness, shortness of breath and wheezing.   Cardiovascular: Negative for chest pain and palpitations.  Gastrointestinal: Negative for abdominal pain, diarrhea, nausea and vomiting.  Musculoskeletal: Negative for arthralgias, back pain and myalgias.  Skin: Negative for rash.  Neurological: Positive for headaches. Negative for dizziness and light-headedness.     Physical Exam Triage Vital Signs ED Triage Vitals [06/15/17 1159]  Enc Vitals Group     BP 135/80     Pulse Rate 76     Resp 16     Temp 98.7 F (37.1 C)     Temp Source Oral     SpO2 97 %     Weight 172 lb (78 kg)     Height      Head Circumference      Peak Flow      Pain Score 0     Pain Loc      Pain Edu?      Excl. in GC?    No data found.  Updated Vital Signs BP 135/80 (BP Location: Left Arm)  Pulse 76   Temp 98.7 F (37.1 C) (Oral)   Resp 16   Wt 172 lb (78 kg)   SpO2 97%   BMI 24.68 kg/m   Visual Acuity Right Eye Distance:   Left Eye Distance:   Bilateral Distance:    Right Eye Near:   Left Eye Near:    Bilateral Near:     Physical Exam  Constitutional: He is oriented to person, place, and time. He appears well-developed and well-nourished. No distress.  HENT:  Head: Normocephalic and atraumatic.  Right Ear: Tympanic membrane normal.  Left Ear: Tympanic membrane normal.  Nose: Nose normal.  Mouth/Throat: Uvula is midline and mucous membranes are normal. Posterior oropharyngeal erythema present. No oropharyngeal exudate, posterior oropharyngeal edema or tonsillar abscesses.  Eyes: EOM are normal.  Neck: Normal range of motion. Neck supple.  Cardiovascular: Normal rate and regular rhythm.  Pulmonary/Chest: Effort normal. No stridor. No respiratory distress. He has wheezes. He has rhonchi. He has  no rales.  Diffuse wheeze and rhonchi. Occasional productive hacking cough on exam but able to speak in full sentences w/o respiratory distress.  Musculoskeletal: Normal range of motion.  Lymphadenopathy:    He has no cervical adenopathy.  Neurological: He is alert and oriented to person, place, and time.  Skin: Skin is warm and dry. He is not diaphoretic.  Psychiatric: He has a normal mood and affect. His behavior is normal.  Nursing note and vitals reviewed.    UC Treatments / Results  Labs (all labs ordered are listed, but only abnormal results are displayed) Labs Reviewed - No data to display  EKG  EKG Interpretation None       Radiology No results found.  Procedures Procedures (including critical care time)  Medications Ordered in UC Medications - No data to display   Initial Impression / Assessment and Plan / UC Course  I have reviewed the triage vital signs and the nursing notes.  Pertinent labs & imaging results that were available during my care of the patient were reviewed by me and considered in my medical decision making (see chart for details).     Hx and exam c/w bronchitis. Will cover for underlying bacterial cause F/u with PCP in 1 week if not improving.   Final Clinical Impressions(s) / UC Diagnoses   Final diagnoses:  Acute bronchitis, unspecified organism  Wheeze    ED Discharge Orders        Ordered    azithromycin (ZITHROMAX) 250 MG tablet  Daily     06/15/17 1219    predniSONE (DELTASONE) 20 MG tablet     06/15/17 1219    albuterol (PROVENTIL HFA;VENTOLIN HFA) 108 (90 Base) MCG/ACT inhaler  Every 6 hours PRN     06/15/17 1219       Controlled Substance Prescriptions [place] Controlled Substance Registry consulted? Not Applicable   Rolla Platehelps, Lendon George O, PA-C 06/15/17 1304

## 2017-06-15 NOTE — ED Triage Notes (Signed)
Patient c/o 1 week of productive cough with yellow sputum and SOB with minimal exertion. Denies fever. Using a nebulizer @ home.

## 2021-03-07 ENCOUNTER — Ambulatory Visit: Payer: 59 | Admitting: Podiatry

## 2021-03-07 ENCOUNTER — Ambulatory Visit (INDEPENDENT_AMBULATORY_CARE_PROVIDER_SITE_OTHER): Payer: 59

## 2021-03-07 ENCOUNTER — Other Ambulatory Visit: Payer: Self-pay

## 2021-03-07 DIAGNOSIS — M722 Plantar fascial fibromatosis: Secondary | ICD-10-CM | POA: Diagnosis not present

## 2021-03-07 DIAGNOSIS — M79671 Pain in right foot: Secondary | ICD-10-CM | POA: Diagnosis not present

## 2021-03-07 MED ORDER — TRIAMCINOLONE ACETONIDE 10 MG/ML IJ SUSP
10.0000 mg | Freq: Once | INTRAMUSCULAR | Status: AC
  Administered 2021-03-07: 10 mg

## 2021-03-07 NOTE — Patient Instructions (Signed)

## 2021-03-13 NOTE — Progress Notes (Signed)
Subjective:   Patient ID: Jorge Costa, male   DOB: 35 y.o.   MRN: [id number]   HPI 35 year old male presents the office with concerns of right heel pain which is been ongoing about 6 weeks.  Describes discomfort center of his heel he states that towards the end the day he has difficulty putting weight.  He has pain when he first gets up and as well as the evening.  Endings on his foot is not hurting as much.  He states it feels the best wearing Brooks running shoes.  Work shoes cause the most discomfort.  He has tried icing, stretching with minimal help.  No numbness or tingling.   Review of Systems  All other systems reviewed and are negative.  Past Medical History:  Diagnosis Date   Childhood asthma    Kidney donor     Past Surgical History:  Procedure Laterality Date   KIDNEY DONATION     NEPHRECTOMY     SHOULDER SURGERY  2006   right    No current outpatient medications on file.  Allergies  Allergen Reactions   Nsaids Other (See Comments)    Patient has 1 kidney Pt is live kidney donor and nsaids are contraindicated.          Objective:  Physical Exam  General: AAO x3, NAD  Dermatological: Skin is warm, dry and supple bilateral. There are no open sores, no preulcerative lesions, no rash or signs of infection present.  Vascular: Dorsalis Pedis artery and Posterior Tibial artery pedal pulses are 2/4 bilateral with immedate capillary fill time.  There is no pain with calf compression, swelling, warmth, erythema.   Neruologic: Grossly intact via light touch bilateral.  Negative Tinel sign.  Musculoskeletal: Tenderness to palpation along the plantar medial tubercle of the calcaneus at the insertion of plantar fascia on the right foot. There is no pain along the course of the plantar fascia within the arch of the foot. Plantar fascia appears to be intact. There is no pain with lateral compression of the calcaneus or pain with vibratory sensation. There is no pain along  the course or insertion of the achilles tendon. No other areas of tenderness to bilateral lower extremities.  Muscular strength 5/5 in all groups tested bilateral.  Gait: Unassisted, Nonantalgic.       Assessment:   Right heel pain, Planter fasciitis     Plan:  -Treatment options discussed including all alternatives, risks, and complications -Etiology of symptoms were discussed -X-rays were obtained and reviewed with the patient.  No evidence of acute fracture. -Steroid injection was performed.  See procedure note below.  If no improvement will do oral steroids.  He cannot take anti-inflammatories. -Plantar fascial brace dispensed -Stretching, icing daily.  Discussed shoe modifications and good arch support.  Procedure: Injection Tendon/Ligament Discussed alternatives, risks, complications and verbal consent was obtained.  Location: Right plantar fascia at the glabrous junction; medial approach. Skin Prep: Alcohol. Injectate: 0.5cc 0.5% marcaine plain, 0.5 cc 2% lidocaine plain and, 1 cc kenalog 10. Disposition: Patient tolerated procedure well. Injection site dressed with a band-aid.  Post-injection care was discussed and return precautions discussed.   Return if symptoms worsen or fail to improve.  Vivi Barrack DPM

## 2021-05-26 ENCOUNTER — Other Ambulatory Visit: Payer: Self-pay

## 2021-05-26 ENCOUNTER — Ambulatory Visit: Payer: 59 | Admitting: Podiatry

## 2021-05-26 ENCOUNTER — Encounter: Payer: Self-pay | Admitting: Podiatry

## 2021-05-26 DIAGNOSIS — M722 Plantar fascial fibromatosis: Secondary | ICD-10-CM

## 2021-05-26 NOTE — Progress Notes (Signed)
  Subjective:  Patient ID: Jorge Costa, male    DOB: [birth year],   MRN: [id number]  No chief complaint on file.   35 y.o. male presents for follow-up of right foot plantar fasciitis. Was seen by Dr. Ardelle Anton and given stretching exercises a brace and injection. Relates he was doing great thought he was healed. Recently has had more tenderness and unable to run or walk.  . Denies any other pedal complaints. Denies n/v/f/c.   Past Medical History:  Diagnosis Date   Childhood asthma    Kidney donor     Objective:  Physical Exam: Vascular: DP/PT pulses 2/4 bilateral. CFT <3 seconds. Normal hair growth on digits. No edema.  Skin. No lacerations or abrasions bilateral feet.  Musculoskeletal: MMT 5/5 bilateral lower extremities in DF, PF, Inversion and Eversion. Deceased ROM in DF of ankle joint. Tender to central band of right plantar fascias at calcaneal tubercle. No pain along achilles. Minimal pain to medial calcaneal tubercle. No pain along arch or with medial squeeze of calcaneus.  Neurological: Sensation intact to light touch.   Assessment:   1. Plantar fasciitis, right      Plan:  Patient was evaluated and treated and all questions answered. Discussed plantar fasciitis with patient.  X-rays reviewed and discussed with patient. No acute fractures or dislocations noted. Mild spurring noted at inferior calcaneus.  Discussed treatment options including, ice, NSAIDS, supportive shoes, bracing, and stretching.  Discussed continuing with stretching exercises. Continue with brace and supportive shoes.  Patient requesting injection today. Procedure note below.   Follow-up as needed. In the meantime, encouraged to call the office with any questions, concerns, change in symptoms.   Procedure:  Discussed etiology, pathology, conservative vs. surgical therapies. At this time a plantar fascial injection was recommended.  The patient agreed and a sterile skin prep was applied.  An injection  consisting of  dexamethasone and marcaine mixture was infiltrated at the point of maximal tenderness on the right Heel.  Bandaid applied. The patient tolerated this well and was given instructions for aftercare.    Louann Sjogren, DPM

## 2021-06-22 ENCOUNTER — Encounter: Payer: Self-pay | Admitting: Podiatry

## 2021-06-22 ENCOUNTER — Ambulatory Visit: Payer: 59 | Admitting: Podiatry

## 2021-06-22 ENCOUNTER — Other Ambulatory Visit: Payer: Self-pay

## 2021-06-22 DIAGNOSIS — M722 Plantar fascial fibromatosis: Secondary | ICD-10-CM

## 2021-06-22 NOTE — Progress Notes (Signed)
  Subjective:  Patient ID: Jorge Costa, male    DOB: 12-Aug-1985,   MRN: [id number]  Chief Complaint  Patient presents with   Foot Pain    Right foot PF pain - patient states last injection did not help he is still feel pain on right right foot on the right side and near the heel     35 y.o. male presents for follow-up of right foot plantar fasciitis. Relates last injection did not help. Relates the pain is now all around his heel and up the back Has been wearing his brace quite often. Avoids NSAIDs due to being a kidney donor. Denies any other pedal complaints. Denies n/v/f/c.   Past Medical History:  Diagnosis Date   Childhood asthma    Kidney donor     Objective:  Physical Exam: Vascular: DP/PT pulses 2/4 bilateral. CFT <3 seconds. Normal hair growth on digits. No edema.  Skin. No lacerations or abrasions bilateral feet.  Musculoskeletal: MMT 5/5 bilateral lower extremities in DF, PF, Inversion and Eversion. Deceased ROM in DF of ankle joint. Tender to central band of right plantar fascias at calcaneal tubercle. No pain along achilles. Minimal pain to medial calcaneal tubercle. No pain along arch or with medial squeeze of calcaneus.  Neurological: Sensation intact to light touch.   Assessment:   1. Plantar fasciitis, right       Plan:  Patient was evaluated and treated and all questions answered. Discussed plantar fasciitis with patient.  X-rays reviewed and discussed with patient. No acute fractures or dislocations noted. Mild spurring noted at inferior calcaneus.  Discussed treatment options including, ice, NSAIDS, supportive shoes, bracing, and stretching.  Discussed continuing with stretching exercises. Referral for PT.  Continue with brace and supportive shoes.  Discussed inserts.  No injection today.  Follow-up in 6 weeks.  In the meantime, encouraged to call the office with any questions, concerns, change in symptoms.      Louann Sjogren, DPM

## 2021-08-03 ENCOUNTER — Ambulatory Visit (INDEPENDENT_AMBULATORY_CARE_PROVIDER_SITE_OTHER): Payer: Self-pay | Admitting: Podiatry

## 2021-08-03 DIAGNOSIS — Z91199 Patient's noncompliance with other medical treatment and regimen due to unspecified reason: Secondary | ICD-10-CM

## 2021-08-03 NOTE — Progress Notes (Signed)
No show

## 2021-09-08 ENCOUNTER — Ambulatory Visit: Payer: 59 | Admitting: Podiatry

## 2021-09-08 ENCOUNTER — Ambulatory Visit (INDEPENDENT_AMBULATORY_CARE_PROVIDER_SITE_OTHER): Payer: 59

## 2021-09-08 ENCOUNTER — Encounter: Payer: Self-pay | Admitting: Podiatry

## 2021-09-08 ENCOUNTER — Other Ambulatory Visit: Payer: Self-pay

## 2021-09-08 DIAGNOSIS — S90111A Contusion of right great toe without damage to nail, initial encounter: Secondary | ICD-10-CM | POA: Diagnosis not present

## 2021-09-08 DIAGNOSIS — M79674 Pain in right toe(s): Secondary | ICD-10-CM

## 2021-09-08 NOTE — Progress Notes (Signed)
°  Subjective:  Patient ID: Jorge Costa, male    DOB: 1985-09-30,   MRN: [id number]  No chief complaint on file.   36 y.o. male presents for concern of fracture of his right great toe. About a week ago he stubbed it and has been swollen and painful.  Relates he has been in PT for his plantar fasciitis and that has been going well.  Denies any other pedal complaints. Denies n/v/f/c.   Past Medical History:  Diagnosis Date   Childhood asthma    Kidney donor     Objective:  Physical Exam: Vascular: DP/PT pulses 2/4 bilateral. CFT <3 seconds. Normal hair growth on digits. No edema.  Skin. No lacerations or abrasions bilateral feet.  Musculoskeletal: MMT 5/5 bilateral lower extremities in DF, PF, Inversion and Eversion. Deceased ROM in DF of ankle joint. Tender to right hallux mostly along IPJ. Pain with ROM of the IPJ and with the MPJ.  Neurological: Sensation intact to light touch.   Assessment:   1. Contusion of right great toe without damage to nail, initial encounter      Plan:  Patient was evaluated and treated and all questions answered. - X-rays reviewed and discussed with patient. No acute fractures or dislocations noted.  -Discussed treatement options for toe fracture; risks, alternatives, and benefits explained. -Discussed taping of toe.  -Dispensed surgical shoe. Patient to wear at all times and instructed on use -Recommend protection, rest, ice, elevation daily until symptoms improve -Continue with PT for plantar fasciitis.  -Rx pain med/antinflammatories as needed -Patient to return to office as needed.    Lorenda Peck, DPM

## 2021-09-28 ENCOUNTER — Ambulatory Visit: Payer: 59 | Admitting: Podiatry

## 2023-08-16 IMAGING — DX DG FOOT COMPLETE 3+V*R*
3 series · 3 of 3 positions shown · non-contrast
Comparison: X-ray 03/15/2021.

CLINICAL DATA: Right great toe pain

EXAM:
RIGHT FOOT COMPLETE - 3+ VIEW

[foot ap]
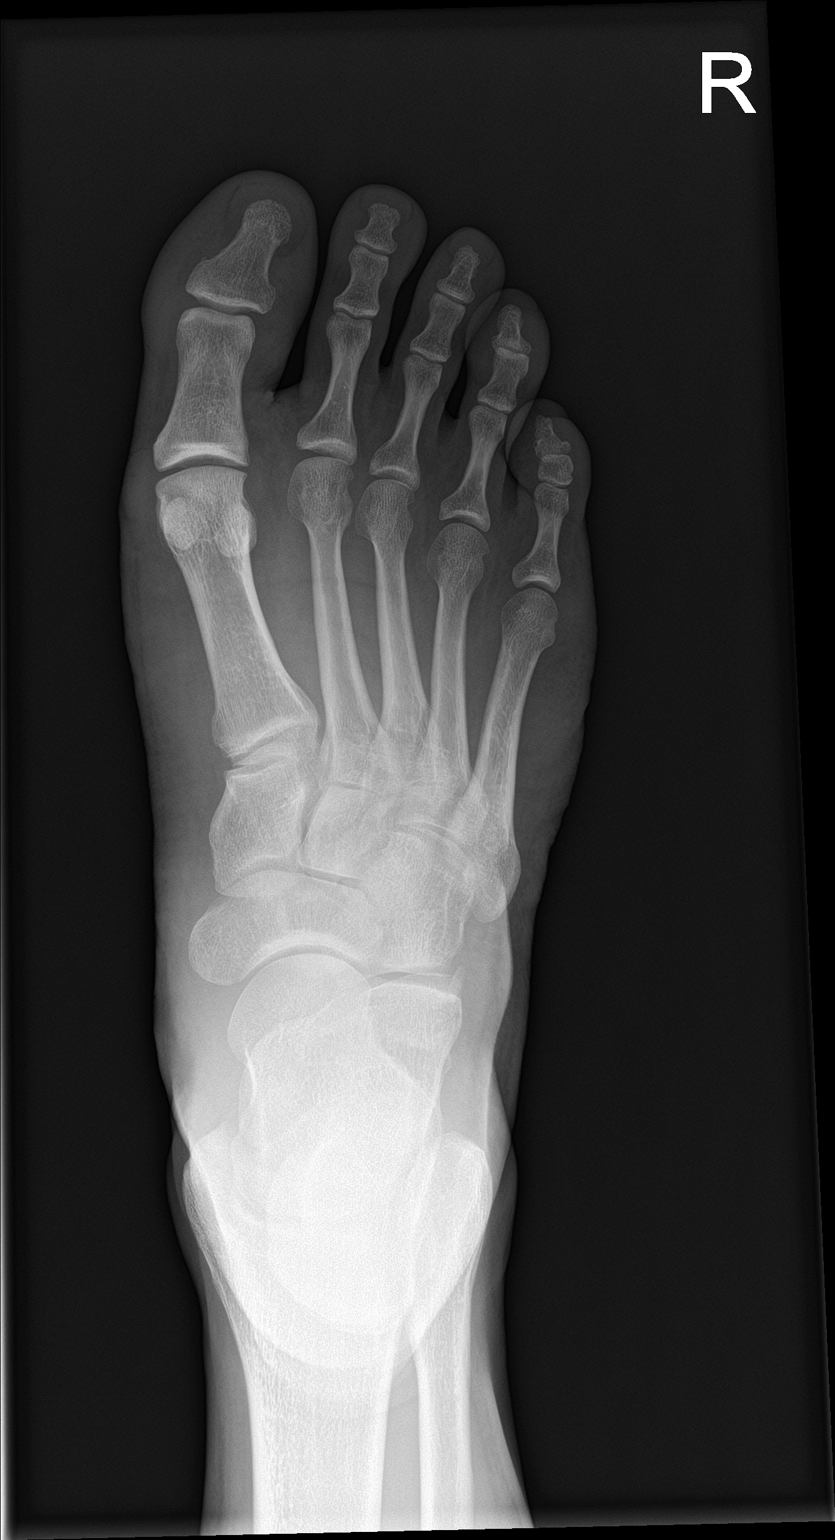

[foot obl]
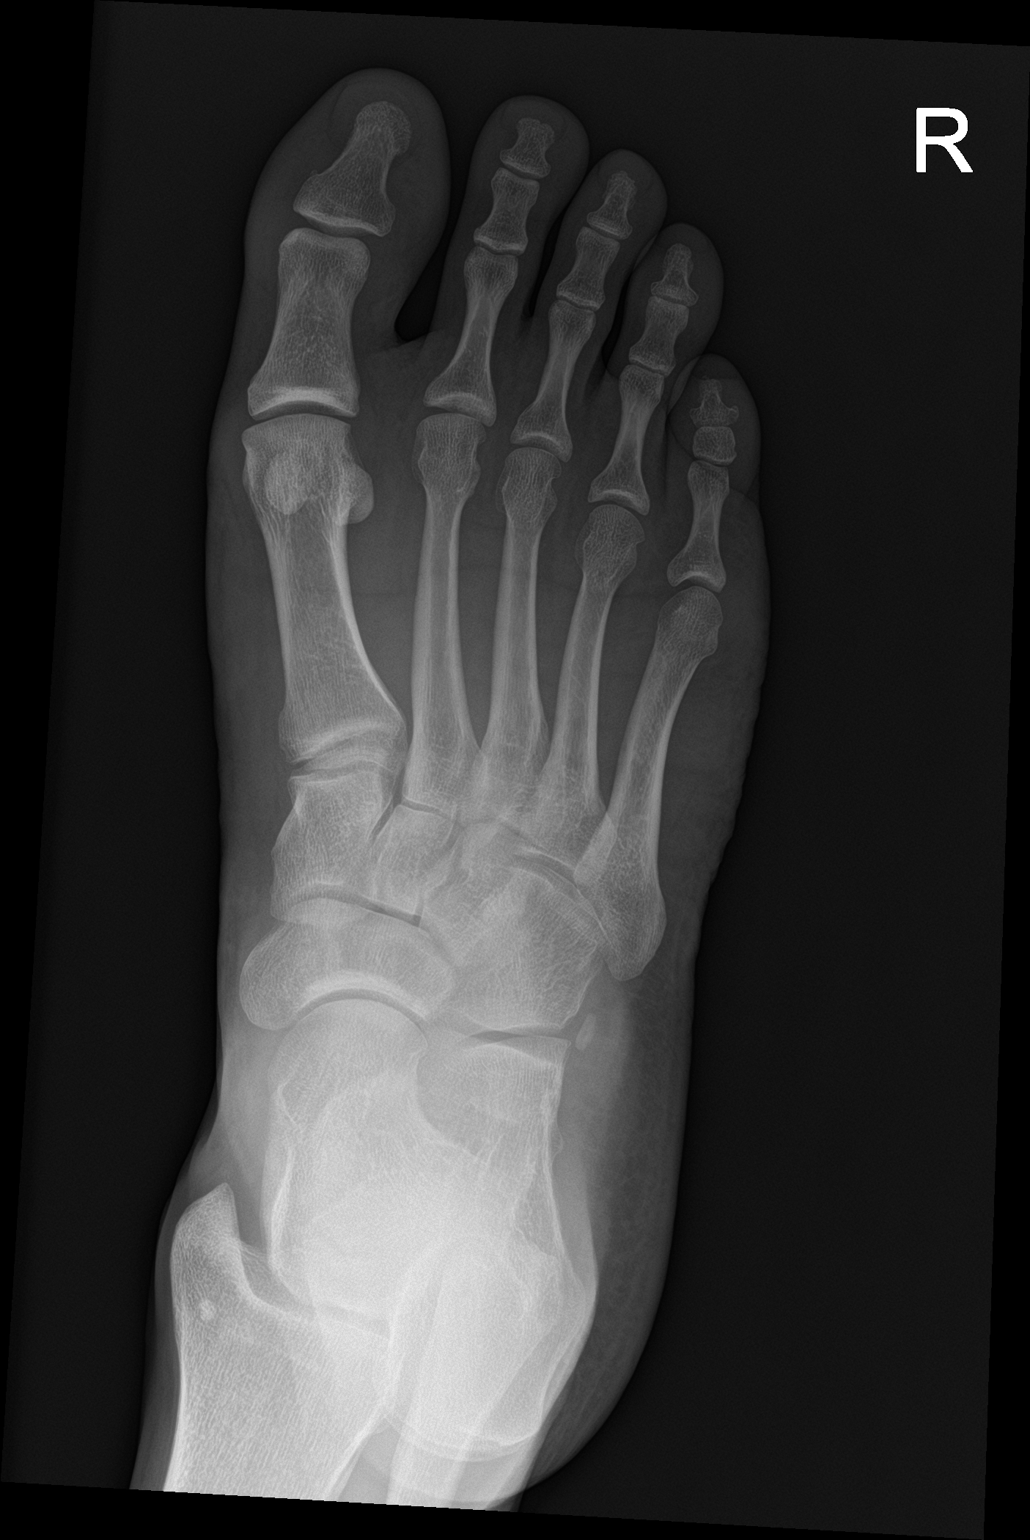

[foot lat]
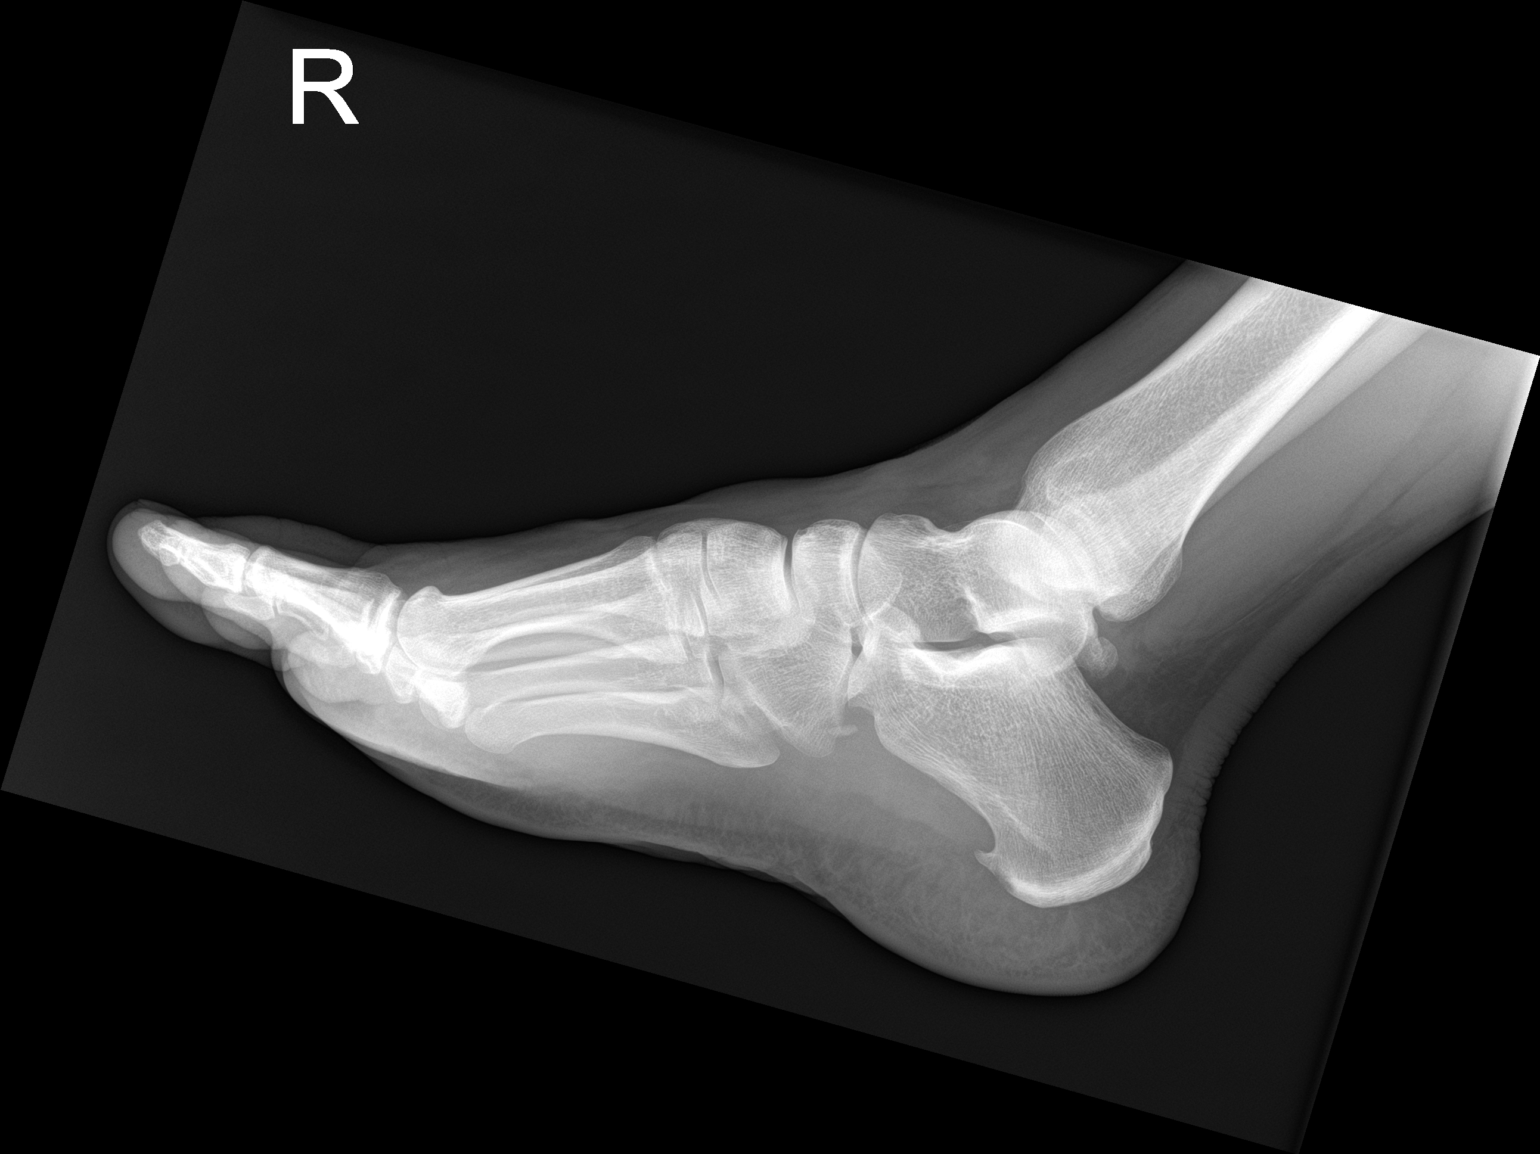

[3 of 3 positions shown; findings below may reference images not displayed]

FINDINGS: There is again minimal great toe metatarsophalangeal joint space
narrowing and lateral metatarsal head degenerative osteophytosis. No
acute fracture or dislocation. Small calcaneal heel spur.
IMPRESSION: Unchanged minimal great toe metatarsophalangeal joint
osteoarthritis.
